# Patient Record
Sex: Male | Born: 2000 | Race: White | Hispanic: No | Marital: Single | Smoking: Never smoker
Health system: Southern US, Community
[De-identification: ages and names within clinical notes are randomized; demographics above are authoritative.]

## PROBLEM LIST (undated history)

## (undated) ENCOUNTER — Emergency Department (HOSPITAL_COMMUNITY): Admission: EM | Payer: Self-pay | Source: Home / Self Care

## (undated) HISTORY — PX: TYMPANOSTOMY TUBE PLACEMENT: SHX32

---
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
## 2020-01-15 DIAGNOSIS — L508 Other urticaria: Secondary | ICD-10-CM | POA: Diagnosis not present

## 2020-03-20 DIAGNOSIS — H5203 Hypermetropia, bilateral: Secondary | ICD-10-CM | POA: Diagnosis not present

## 2020-03-25 DIAGNOSIS — T1591XA Foreign body on external eye, part unspecified, right eye, initial encounter: Secondary | ICD-10-CM | POA: Diagnosis not present

## 2020-03-25 DIAGNOSIS — T1501XA Foreign body in cornea, right eye, initial encounter: Secondary | ICD-10-CM | POA: Diagnosis not present

## 2020-03-25 DIAGNOSIS — H5711 Ocular pain, right eye: Secondary | ICD-10-CM | POA: Diagnosis not present

## 2020-05-11 DIAGNOSIS — Z20828 Contact with and (suspected) exposure to other viral communicable diseases: Secondary | ICD-10-CM | POA: Diagnosis not present

## 2020-05-11 DIAGNOSIS — B349 Viral infection, unspecified: Secondary | ICD-10-CM | POA: Diagnosis not present

## 2020-06-12 IMAGING — XA DG FLUORO GUIDE NDL PLC/BX
2 series · 2 of 2 positions shown · non-contrast
Comparison: none

CLINICAL DATA: RIGHT shoulder pain.

[Series 1: ortho standard · 1 of 1 slices shown (1 of 2)]
[im 1/1]
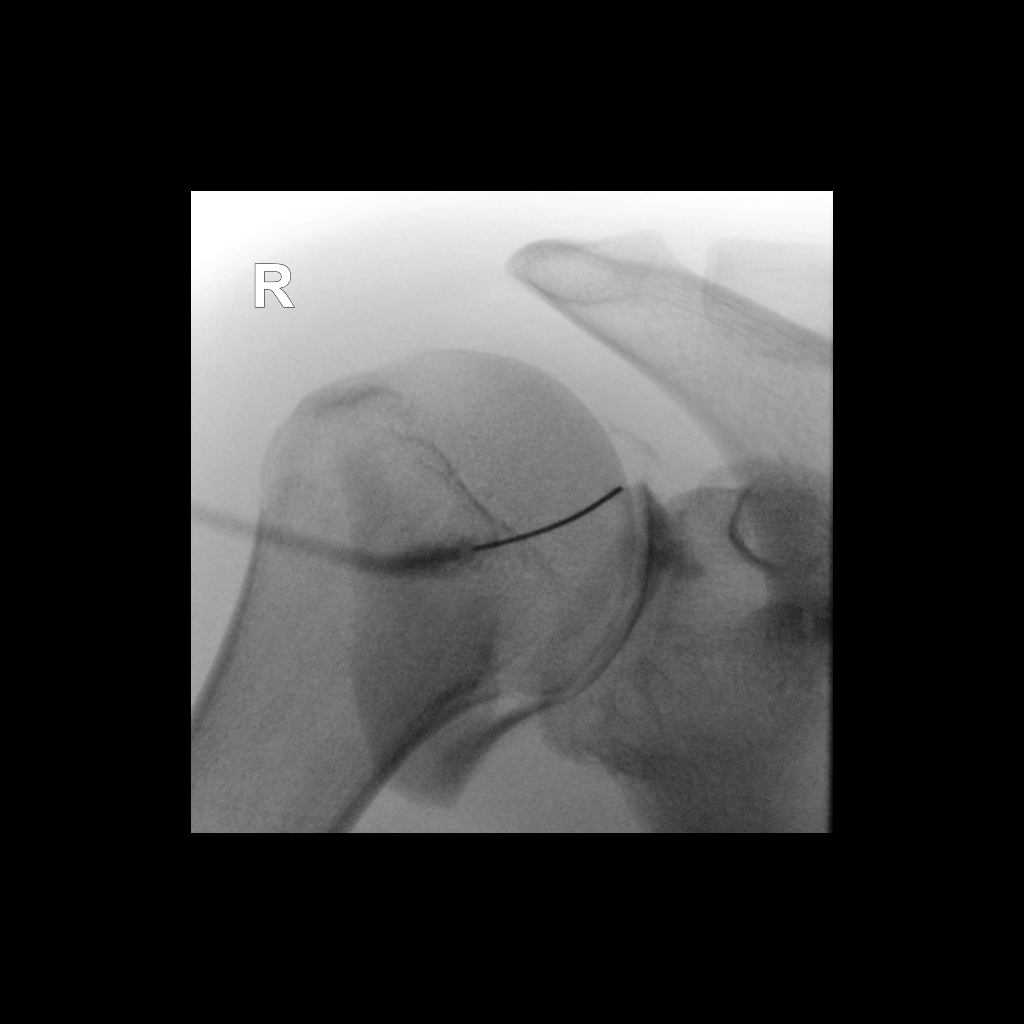

[Series 2: ortho standard · 1 of 1 slices shown (2 of 2)]
[im 1/1]
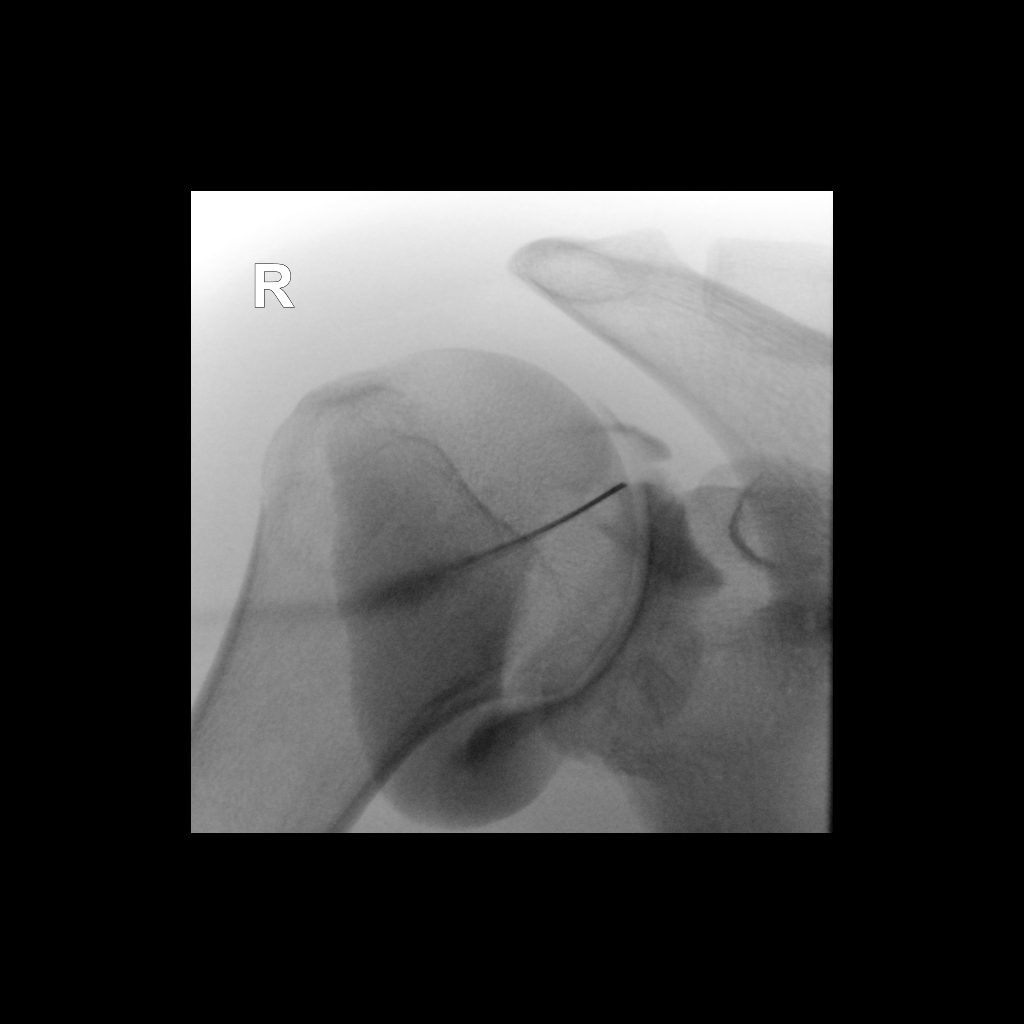

[2 of 2 positions shown; findings below may reference images not displayed]

FLUOROSCOPY TIME:  15 seconds corresponding to a Dose Area Product
of 10.51 Gy*m2

PROCEDURE:
RIGHT SHOULDER INJECTION UNDER FLUOROSCOPY

Informed written consent was obtained.  Time-out was performed.

An appropriate skin entrance site was determined. The site was
marked, prepped with Betadine, draped in the usual sterile fashion,
and infiltrated locally with 1% lidocaine. 22 gauge spinal needle
was advanced to the superomedial margin of the humeral head under
intermittent fluoroscopy. 1 ml of Lidocaine injected easily. A
mixture of 0.1 ml Multihance and 20 ml of dilute Isovue M 200 was
then used to opacify the RIGHT shoulder capsule. No immediate
complication.
IMPRESSION: Technically successful RIGHT shoulder injection for MRI.

## 2021-02-16 DIAGNOSIS — A932 Colorado tick fever: Secondary | ICD-10-CM | POA: Diagnosis not present

## 2021-08-19 ENCOUNTER — Other Ambulatory Visit (HOSPITAL_COMMUNITY): Payer: Self-pay

## 2022-10-10 ENCOUNTER — Ambulatory Visit (HOSPITAL_COMMUNITY)
Admission: EM | Admit: 2022-10-10 | Discharge: 2022-10-10 | Disposition: A | Discharge status: Home / Self Care | Payer: BC Managed Care – PPO

## 2022-10-10 DIAGNOSIS — F22 Delusional disorders: Secondary | ICD-10-CM

## 2022-10-10 DIAGNOSIS — F419 Anxiety disorder, unspecified: Secondary | ICD-10-CM | POA: Diagnosis not present

## 2022-10-10 DIAGNOSIS — R4589 Other symptoms and signs involving emotional state: Secondary | ICD-10-CM | POA: Diagnosis not present

## 2022-10-10 NOTE — Discharge Instructions (Addendum)
  Patient has established services with LCSW, Jasmine Williams at the [facility] for outpatient therapy, case management services and medication management .  In case of an urgent crisis, you may contact the Mobile Crisis Unit with [facility] at 1.877.626.1772.  Patient is instructed prior to discharge to:  Take all medications as prescribed by his/her mental healthcare provider. Report any adverse effects and or reactions from the medicines to his/her outpatient provider promptly. Keep all scheduled appointments, to ensure that you are getting refills on time and to avoid any interruption in your medication.  If you are unable to keep an appointment call to reschedule.  Be sure to follow-up with resources and follow-up appointments provided.  Patient has been instructed & cautioned: To not engage in alcohol and or illegal drug use while on prescription medicines. In the event of worsening symptoms, patient is instructed to call the crisis hotline, 911 and or go to the nearest ED for appropriate evaluation and treatment of symptoms. To follow-up with his/her primary care provider for your other medical issues, concerns and or health care needs.  Information: -National Suicide Prevention Lifeline 1-800-SUICIDE or 1-800-273-8255.  -988 offers 24/7 access to trained crisis counselors who can help people experiencing mental health-related distress. People can call or text 988 or chat 988lifeline.org for themselves or if they are worried about a loved one who may need crisis support.      

## 2022-10-10 NOTE — ED Triage Notes (Signed)
Pt presents to [facility] voluntarily, unaccompanied at this time as he states " I can't trust nobody". Pt refused to disclose why he feels this way and trailed off to a few different topics. Pt reports that he often feels like that no one understands him. Pt has history of paranoia and was hospitalized impatient in 2019 at [facility]. Pt currently denies SI, HI, AVH and substance/alchohol use.

## 2022-10-10 NOTE — ED Notes (Signed)
Patient supplied with AVS. Discharge instructions and follow up instructions supplied and explained to patient. Patient verbalized understanding and denied further questions. Patient was escorted out of building without incident. 
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
## 2022-10-18 ENCOUNTER — Ambulatory Visit (HOSPITAL_COMMUNITY)
Admission: EM | Admit: 2022-10-18 | Discharge: 2022-10-19 | Disposition: A | Discharge status: Other Acute Inpatient Hospital | Payer: BC Managed Care – PPO | Attending: Psychiatry | Admitting: Psychiatry

## 2022-10-18 DIAGNOSIS — F259 Schizoaffective disorder, unspecified: Secondary | ICD-10-CM | POA: Diagnosis not present

## 2022-10-18 DIAGNOSIS — F1290 Cannabis use, unspecified, uncomplicated: Secondary | ICD-10-CM | POA: Insufficient documentation

## 2022-10-18 DIAGNOSIS — Z1152 Encounter for screening for COVID-19: Secondary | ICD-10-CM | POA: Insufficient documentation

## 2022-10-18 DIAGNOSIS — F29 Unspecified psychosis not due to a substance or known physiological condition: Secondary | ICD-10-CM

## 2022-10-18 LAB — CBC WITH DIFFERENTIAL/PLATELET
Abs Immature Granulocytes: 0 10*3/uL (ref 0.00–0.07)
Basophils Absolute: 0.1 10*3/uL (ref 0.0–0.1)
Basophils Relative: 1 %
Eosinophils Absolute: 0.2 10*3/uL (ref 0.0–0.5)
Eosinophils Relative: 5 %
HCT: 40.8 % (ref 39.0–52.0)
Hemoglobin: 14.5 g/dL (ref 13.0–17.0)
Immature Granulocytes: 0 %
Lymphocytes Relative: 45 %
Lymphs Abs: 2.4 10*3/uL (ref 0.7–4.0)
MCH: 30.3 pg (ref 26.0–34.0)
MCHC: 35.5 g/dL (ref 30.0–36.0)
MCV: 85.2 fL (ref 80.0–100.0)
Monocytes Absolute: 0.4 10*3/uL (ref 0.1–1.0)
Monocytes Relative: 8 %
Neutro Abs: 2.2 10*3/uL (ref 1.7–7.7)
Neutrophils Relative %: 41 %
Platelets: 277 10*3/uL (ref 150–400)
RBC: 4.79 MIL/uL (ref 4.22–5.81)
RDW: 12 % (ref 11.5–15.5)
WBC: 5.3 10*3/uL (ref 4.0–10.5)
nRBC: 0 % (ref 0.0–0.2)

## 2022-10-18 LAB — RESP PANEL BY RT-PCR (RSV, FLU A&B, COVID)  RVPGX2
Influenza A by PCR: NEGATIVE
Influenza B by PCR: NEGATIVE
Resp Syncytial Virus by PCR: NEGATIVE
SARS Coronavirus 2 by RT PCR: NEGATIVE

## 2022-10-18 LAB — POCT URINE DRUG SCREEN - MANUAL ENTRY (I-SCREEN)
POC Amphetamine UR: POSITIVE — AB
POC Buprenorphine (BUP): NOT DETECTED
POC Cocaine UR: NOT DETECTED
POC Marijuana UR: NOT DETECTED
POC Methadone UR: NOT DETECTED
POC Methamphetamine UR: POSITIVE — AB
POC Morphine: NOT DETECTED
POC Oxazepam (BZO): NOT DETECTED
POC Oxycodone UR: NOT DETECTED
POC Secobarbital (BAR): NOT DETECTED

## 2022-10-18 LAB — COMPREHENSIVE METABOLIC PANEL
ALT: 11 U/L (ref 0–44)
AST: 12 U/L — ABNORMAL LOW (ref 15–41)
Albumin: 3.9 g/dL (ref 3.5–5.0)
Alkaline Phosphatase: 57 U/L (ref 38–126)
Anion gap: 6 (ref 5–15)
BUN: 12 mg/dL (ref 6–20)
CO2: 29 mmol/L (ref 22–32)
Calcium: 9.6 mg/dL (ref 8.9–10.3)
Chloride: 105 mmol/L (ref 98–111)
Creatinine, Ser: 1 mg/dL (ref 0.61–1.24)
GFR, Estimated: >60 mL/min (ref >60)
Glucose, Bld: 118 mg/dL — ABNORMAL HIGH (ref 70–99)
Potassium: 4.1 mmol/L (ref 3.5–5.1)
Sodium: 140 mmol/L (ref 135–145)
Total Bilirubin: 0.3 mg/dL (ref 0.3–1.2)
Total Protein: 6.2 g/dL — ABNORMAL LOW (ref 6.5–8.1)

## 2022-10-18 LAB — LIPID PANEL
Cholesterol: 146 mg/dL (ref 0–200)
HDL: 46 mg/dL (ref >40)
LDL Cholesterol: 91 mg/dL (ref 0–99)
Total CHOL/HDL Ratio: 3.2 RATIO
Triglycerides: 47 mg/dL (ref <150)
VLDL: 9 mg/dL (ref 0–40)

## 2022-10-18 LAB — ETHANOL: Alcohol, Ethyl (B): <10 mg/dL (ref <10)

## 2022-10-18 LAB — TSH: TSH: 0.603 u[IU]/mL (ref 0.350–4.500)

## 2022-10-18 LAB — MAGNESIUM: Magnesium: 2.1 mg/dL (ref 1.7–2.4)

## 2022-10-18 MED ORDER — ACETAMINOPHEN 325 MG PO TABS: 650.0000 mg | ORAL_TABLET | Freq: Four times a day (QID) | ORAL | Status: DC | PRN

## 2022-10-18 MED ORDER — OLANZAPINE 5 MG PO TBDP
5.0000 mg | ORAL_TABLET | Freq: Two times a day (BID) | ORAL | Status: DC
  Administered 2022-10-18 – 2022-10-19 (×3): 5 mg via ORAL
  Filled 2022-10-18 (×3): qty 1

## 2022-10-18 MED ORDER — HALOPERIDOL LACTATE 5 MG/ML IJ SOLN: 5.0000 mg | Freq: Three times a day (TID) | INTRAMUSCULAR | Status: DC | PRN

## 2022-10-18 MED ORDER — HYDROXYZINE HCL 25 MG PO TABS
25.0000 mg | ORAL_TABLET | Freq: Three times a day (TID) | ORAL | Status: DC | PRN
  Administered 2022-10-18: 25 mg via ORAL
  Filled 2022-10-18: qty 1

## 2022-10-18 MED ORDER — ALUM & MAG HYDROXIDE-SIMETH 200-200-20 MG/5ML PO SUSP: 30.0000 mL | ORAL | Status: DC | PRN

## 2022-10-18 MED ORDER — TRAZODONE HCL 50 MG PO TABS
50.0000 mg | ORAL_TABLET | Freq: Every evening | ORAL | Status: DC | PRN
  Administered 2022-10-18: 50 mg via ORAL
  Filled 2022-10-18: qty 1

## 2022-10-18 MED ORDER — LORAZEPAM 2 MG/ML IJ SOLN: 2.0000 mg | Freq: Three times a day (TID) | INTRAMUSCULAR | Status: DC | PRN

## 2022-10-18 MED ORDER — MAGNESIUM HYDROXIDE 400 MG/5ML PO SUSP: 30.0000 mL | Freq: Every day | ORAL | Status: DC | PRN

## 2022-10-18 MED ORDER — DIPHENHYDRAMINE HCL 50 MG/ML IJ SOLN: 50.0000 mg | Freq: Three times a day (TID) | INTRAMUSCULAR | Status: DC | PRN

## 2022-10-18 MED ORDER — OLANZAPINE 5 MG PO TBDP: 5.0000 mg | ORAL_TABLET | Freq: Two times a day (BID) | ORAL | Status: AC

## 2022-10-18 NOTE — ED Notes (Signed)
Pt sleeping@this time. breathing even and unlabored. Will continue to monitor for safety 
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
## 2022-10-18 NOTE — Discharge Instructions (Addendum)
Transfer to [facility] for IP treatment Zachary Jungling, MD accepting MD

## 2022-10-18 NOTE — Progress Notes (Signed)
   10/18/22 [address] (Walk-ins at [facility] only)  How Did You Hear About [place]? Family/Friend (Mother brought him in)  What Is the Reason for Your Visit/Call Today? Pt is a 22 yo male who presented voluntarily and accompanied by his mother due to worsening psychotic symptoms including delusional, paranoid, disorganized thinking and erratic behavior. Pt stated that he has bee diagnosed with schizoaffective d/o in the past. He is not taking any prescribed medications currently despite being prescribed them in 2019 and stating that they helped him "a lot." Pt stated he has had one IP psychiatric admission in 2019 after a psychotic episode he and mother state happened after he "dropped some acid." Pt denies any current substance use except for bi-monthly use of cannabis. Pt denied SI, HI, NSSH and AVH.  How Long Has This Been Causing You Problems? 1-6 months  Have You Recently Had Any Thoughts About Hurting Yourself? No  Are You Planning to Commit Suicide/Harm Yourself At This time? No  Have you Recently Had Thoughts About [place]? No  Are You Planning To Harm Someone At This Time? No  Are you currently experiencing any auditory, visual or other hallucinations? No  Have You Used Any Alcohol or Drugs in the Past 24 Hours? No  Do you have any current medical co-morbidities that require immediate attention? No  Clinician description of patient physical appearance/behavior: Pt is calm but alternatively quiet and then suddenly loud and animated in his gestures. Pt is alert and seemed fully oriented and did not appear to be responding to internal stimuli. Pt did not appear to be intoxicated. Pt's speech was disorganized with tangential paranoid thought content. Pt's mood was euthymic but at times, anxious due to thoughts of a conspiracy of people against him including family members and co-workers.  What Do You Feel Would Help You the Most Today? Treatment for Depression or other mood  problem  If access to [facility] was not available, would you have sought care in the Emergency Department? Yes  Determination of Need Urgent (48 hours) (Per Thomes Lolling pt is recommended for Inpatient psychiatric treatment.)  Options For Referral Inpatient Hospitalization   Jaislyn Blinn T. Mare Ferrari, [place], [facility], [facility] Triage Specialist [facility]

## 2022-10-18 NOTE — Progress Notes (Signed)
Pt was accepted to [facility] 10/19/2022, pending IVC paperwork faxed to [phone]. Bed assignment: Main campus  Pt meets inpatient criteria per Thomes Lolling, NP  Attending Physician will be Leta Jungling, MD  Report can be called to: [phone] (this is a pager, please leave call-back number when giving report)  Pt can arrive after 8 AM  Care Team Notified: Lynnda Shields, RN, Thomes Lolling, NP, and [address] [place], [place]  10/18/2022 2:24 PM

## 2022-10-18 NOTE — Progress Notes (Signed)
Pt presented voluntarily and accompanied by family due to worsening psychotic symptoms including delusional, paranoid, disorganized thinking and erratic behavior. Pt refusing to allow staff to complete work up for blood draw and EKG and covid samples. Per NP IVC was initiated and this writer called to have pt served and copies of IVC placed in chart. WHen patient was served [employer] escort explained to patient that pt was being placed under IVC and with encouragement pt did allow staff to complete work up. NP and MD present during this time. Pt became tearful on the unit, appears very guarded not willing to answer further questions and refused to sign consent for ROI to mother. Pt states '' yeah this is just awful. So now you're going to give me a pill to do what? '' Pt did accept medications. Oriented to unit and given sandwich, juice. Pt is safe. Will con't to monitor. Pt is safe.

## 2022-10-18 NOTE — Progress Notes (Signed)
LCSW Progress Note  [id number]   Zachary Watts  10/18/2022  1:55 PM  Description:   Inpatient Psychiatric Referral  Patient was recommended inpatient per Thomes Lolling, NP. There are no available beds at [facility]. Patient was referred to the following facilities:   Destination  Service Provider Address Phone [facility]  [address] [place] [id number] [phone]  [facility]  [place], [place] [address] [place]  [facility]  [address] [place] [address] [phone] [place]  [place], [place] [address] [phone]  [facility]  [id number] [place] [address] [phone] [facility]  [address] [place], [place] [address] [place]  [facility]  [address] [place] [address] [place]  [facility]  [address] [place] [address] [phone]  [facility]  [address] [place] [address] [phone]  [facility]  [place] [address] [phone]  [facility]  [address] [place] [address] [phone] [facility]  [address] [place] [address] [phone] [facility]  [address], [place] [address] [place]  [address] [place] [address] [phone]  [facility]  [address] [place] [address] [place]  [facility]  [address], [place] [address] [phone] [place]  [address] [place] [address] [phone]  [address] [place] [address] [phone]  [facility]  [address], [place] [address] [phone] [facility]  [address] [place] [address] [phone] [place]  [place], [place] [address] [phone] [place]  [facility] [place] [address] [phone]  [facility]  [address], [place] [address] [phone] [facility]  [address], [place] [address] [phone]    Situation ongoing, CSW to continue following and update chart as more information becomes available.      Denna Haggard, Latanya Presser  10/18/2022 1:55 PM

## 2022-10-19 LAB — HEMOGLOBIN A1C
Hgb A1c MFr Bld: 5.4 % (ref 4.8–5.6)
Mean Plasma Glucose: 108 mg/dL

## 2022-10-19 NOTE — ED Notes (Signed)
Pt sleeping@this time. Breathing even and unlabored. Will continue to monitor for safety 

## 2022-10-19 NOTE — ED Notes (Signed)
[employer] de[employer] was called and a message left this morning@0501  for transportation to [facility]

## 2022-10-19 NOTE — ED Notes (Signed)
Patient discharge via ambulatory with a steady gait with [facility] department to [facility]. Belongings given to [facility] department staff member. Respirations equal and unlabored, skin warm and dry. No acute distress noted.

## 2022-10-19 NOTE — ED Notes (Signed)
Patient A&Ox4. Patient denies SI/HI and AVH at this time. Patient denies any physical complaints when asked. No acute distress noted. Support and encouragement provided. Routine safety checks conducted according to facility protocol. Encouraged patient to notify staff if thoughts of harm toward self or others arise. Patient verbalize understanding and agreement. Will continue to monitor for safety.
